# Patient Record
Sex: Male | Born: 1985 | Race: Black or African American | Hispanic: No | Marital: Single | Smoking: Current every day smoker
Health system: Southern US, Community
[De-identification: ages and names within clinical notes are randomized; demographics above are authoritative.]

## PROBLEM LIST (undated history)

## (undated) ENCOUNTER — Emergency Department (HOSPITAL_COMMUNITY): Admission: EM | Disposition: A | Discharge status: Home / Self Care | Payer: Medicaid Other

## (undated) HISTORY — PX: FEMUR SURGERY: SHX943

---
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
## 2008-04-20 ENCOUNTER — Emergency Department (HOSPITAL_COMMUNITY): Admission: EM | Admit: 2008-04-20 | Discharge: 2008-04-20 | Payer: Self-pay | Admitting: Emergency Medicine

## 2008-06-22 ENCOUNTER — Emergency Department (HOSPITAL_COMMUNITY): Admission: EM | Admit: 2008-06-22 | Discharge: 2008-06-22 | Payer: Self-pay | Admitting: Emergency Medicine

## 2009-04-29 ENCOUNTER — Emergency Department (HOSPITAL_COMMUNITY): Admission: EM | Admit: 2009-04-29 | Discharge: 2009-04-29 | Payer: Self-pay | Admitting: Emergency Medicine

## 2009-04-30 ENCOUNTER — Emergency Department (HOSPITAL_COMMUNITY): Admission: EM | Admit: 2009-04-30 | Discharge: 2009-04-30 | Payer: Self-pay | Admitting: Emergency Medicine

## 2009-07-25 ENCOUNTER — Emergency Department (HOSPITAL_COMMUNITY): Admission: EM | Admit: 2009-07-25 | Discharge: 2009-07-26 | Payer: Self-pay | Admitting: Emergency Medicine

## 2009-08-04 ENCOUNTER — Emergency Department (HOSPITAL_COMMUNITY): Admission: EM | Admit: 2009-08-04 | Discharge: 2009-08-04 | Payer: Self-pay | Admitting: Emergency Medicine

## 2010-08-06 ENCOUNTER — Emergency Department (HOSPITAL_COMMUNITY): Admission: EM | Admit: 2010-08-06 | Discharge: 2010-08-06 | Payer: Self-pay | Admitting: Emergency Medicine

## 2011-02-02 LAB — GC/CHLAMYDIA PROBE AMP, GENITAL: Chlamydia, DNA Probe: NEGATIVE

## 2011-02-05 LAB — CULTURE, ROUTINE-ABSCESS

## 2011-07-26 LAB — POCT I-STAT, CHEM 8
Creatinine, Ser: 1.1
Hemoglobin: 15
Potassium: 3.2 — ABNORMAL LOW
Sodium: 140
TCO2: 23

## 2011-07-26 LAB — ETHANOL: Alcohol, Ethyl (B): 131 — ABNORMAL HIGH

## 2012-01-27 ENCOUNTER — Emergency Department (HOSPITAL_COMMUNITY)
Admission: EM | Admit: 2012-01-27 | Discharge: 2012-01-27 | Disposition: A | Discharge status: Home / Self Care | Payer: Self-pay | Attending: Emergency Medicine | Admitting: Emergency Medicine

## 2012-01-27 ENCOUNTER — Encounter (HOSPITAL_COMMUNITY): Payer: Self-pay | Admitting: *Deleted

## 2012-01-27 DIAGNOSIS — F17200 Nicotine dependence, unspecified, uncomplicated: Secondary | ICD-10-CM | POA: Insufficient documentation

## 2012-01-27 DIAGNOSIS — L02411 Cutaneous abscess of right axilla: Secondary | ICD-10-CM

## 2012-01-27 DIAGNOSIS — IMO0002 Reserved for concepts with insufficient information to code with codable children: Secondary | ICD-10-CM | POA: Insufficient documentation

## 2012-01-27 MED ORDER — HYDROCODONE-ACETAMINOPHEN 5-325 MG PO TABS: 1.0000 | ORAL_TABLET | ORAL | Status: AC | PRN

## 2012-01-27 MED ORDER — IBUPROFEN 800 MG PO TABS: 400.0000 mg | ORAL_TABLET | Freq: Three times a day (TID) | ORAL | Status: AC

## 2012-01-27 NOTE — Discharge Instructions (Signed)
Take vicodin as prescribed for severe pain.   Do not drive within four hours of taking this medication (may cause drowsiness or confusion).   Take ibuprofen for mild-moderate pain.  Follow up with [facility] [phone]; [address])  Or return to the ED in 2 days for wound recheck and packing removal.  You should be seen sooner if you develop spreading of redness around wound.   Call [facility] [phone]) if you do not have a primary care doctor and would like assistance with finding one.

## 2012-01-27 NOTE — ED Notes (Signed)
Pt reports abscess under right arm starting two days ago.  Pt reports history of having abscesses in this same area. Pt denies taking anything for pain and reports pain is 10/10.  Pt denies abscess drainage.

## 2012-01-27 NOTE — ED Provider Notes (Signed)
History     CSN: [id number]  Arrival date & time 01/27/12  1454   First MD Initiated Contact with Patient 01/27/12 1536      Chief Complaint  Patient presents with  . Abscess    (Consider location/radiation/quality/duration/timing/severity/associated sxs/prior treatment) HPI History provided by pt.   Pt c/o abscess of right axilla x 2 days.  Severely pain.  Has not taken anything for pain.  Has applied warm compresses w/out drainage.  No associated fever.  Has had multiple in same location in the past.    History reviewed. No pertinent past medical history.  History reviewed. No pertinent past surgical history.  No family history on file.  History  Substance Use Topics  . Smoking status: Current Everyday Smoker  . Smokeless tobacco: Not on file  . Alcohol Use: No      Review of Systems  All other systems reviewed and are negative.    Allergies  Review of patient's allergies indicates no known allergies.  Home Medications  No current outpatient prescriptions on file.  BP 133/66  Pulse 64  Temp(Src) 98.3 F (36.8 C) (Oral)  Resp 16  SpO2 100%  Physical Exam  Nursing note and vitals reviewed. Constitutional: He is oriented to person, place, and time. He appears well-developed and well-nourished. No distress.  HENT:  Head: Normocephalic and atraumatic.  Eyes:       Normal appearance  Neck: Normal range of motion.  Neurological: He is alert and oriented to person, place, and time.  Skin:       4x2cm abscess right axilla w/out surrounding induration or erythema.  Very ttp.    Psychiatric: He has a normal mood and affect. His behavior is normal.    ED Course  Procedures (including critical care time)  INCISION AND DRAINAGE Performed by: Otilio Miu Consent: Verbal consent obtained. Risks and benefits: risks, benefits and alternatives were discussed Type: abscess  Body area: right axilla  Anesthesia: local infiltration  Local  anesthetic: lidocaine 2% w/ epinephrine  Anesthetic total: 8 ml  Complexity: complex Blunt dissection to break up loculations  Drainage: purulent  Drainage amount: moderate  Packing material: 1/4 in iodoform gauze  Patient tolerance: Patient tolerated the procedure well with no immediate complications.    Labs Reviewed - No data to display No results found.   1. Abscess of right axilla       MDM  Healthy 25yo F presents w/ abscess of right axilla.  I&D'd and pt d/c'd home w/ ibuprofen and vicodin for pain.  Instructed to f/u with [facility] or return to ED in 2 days for recheck. Return precautions discussed.        Otilio Miu, [place] 01/27/12 [phone]

## 2012-01-27 NOTE — ED Provider Notes (Signed)
Medical screening examination/treatment/procedure(s) were performed by non-physician practitioner and as supervising physician I was immediately available for consultation/collaboration.   Sira Adsit, MD 01/27/12 2320 

## 2012-06-21 ENCOUNTER — Encounter (HOSPITAL_COMMUNITY): Payer: Self-pay | Admitting: *Deleted

## 2012-06-21 ENCOUNTER — Encounter (HOSPITAL_COMMUNITY): Payer: Self-pay | Admitting: Emergency Medicine

## 2012-06-21 ENCOUNTER — Emergency Department (HOSPITAL_COMMUNITY)
Admission: EM | Admit: 2012-06-21 | Discharge: 2012-06-21 | Disposition: A | Discharge status: Home / Self Care | Payer: Self-pay | Attending: Emergency Medicine | Admitting: Emergency Medicine

## 2012-06-21 ENCOUNTER — Emergency Department (HOSPITAL_COMMUNITY)
Admission: EM | Admit: 2012-06-21 | Discharge: 2012-06-21 | Disposition: A | Discharge status: Home / Self Care | Payer: Medicaid Other | Attending: Emergency Medicine | Admitting: Emergency Medicine

## 2012-06-21 DIAGNOSIS — T148XXA Other injury of unspecified body region, initial encounter: Secondary | ICD-10-CM | POA: Insufficient documentation

## 2012-06-21 DIAGNOSIS — W269XXA Contact with unspecified sharp object(s), initial encounter: Secondary | ICD-10-CM | POA: Insufficient documentation

## 2012-06-21 NOTE — ED Notes (Signed)
Dr. Ranae Palms came to triage to examine patient.  No trauma activation.

## 2012-06-21 NOTE — ED Notes (Signed)
Called in waiting room x3 with no response 

## 2012-06-21 NOTE — ED Notes (Signed)
Pt refused to stay for treatment for [employer], pt never assessed by this Clinical research associate
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
## 2012-06-26 ENCOUNTER — Encounter (HOSPITAL_COMMUNITY): Payer: Self-pay

## 2012-06-26 ENCOUNTER — Emergency Department (HOSPITAL_COMMUNITY)
Admission: EM | Admit: 2012-06-26 | Discharge: 2012-06-26 | Disposition: A | Discharge status: Home / Self Care | Payer: Medicaid Other | Attending: Emergency Medicine | Admitting: Emergency Medicine

## 2012-06-26 ENCOUNTER — Emergency Department (HOSPITAL_COMMUNITY): Payer: Medicaid Other

## 2012-06-26 DIAGNOSIS — R109 Unspecified abdominal pain: Secondary | ICD-10-CM | POA: Insufficient documentation

## 2012-06-26 DIAGNOSIS — F17200 Nicotine dependence, unspecified, uncomplicated: Secondary | ICD-10-CM | POA: Insufficient documentation

## 2012-06-26 DIAGNOSIS — Z09 Encounter for follow-up examination after completed treatment for conditions other than malignant neoplasm: Secondary | ICD-10-CM | POA: Insufficient documentation

## 2012-06-26 DIAGNOSIS — M549 Dorsalgia, unspecified: Secondary | ICD-10-CM | POA: Insufficient documentation

## 2012-06-26 LAB — CBC
Platelets: 176 10*3/uL (ref 150–400)
RBC: 4.59 MIL/uL (ref 4.22–5.81)
RDW: 13.7 % (ref 11.5–15.5)
WBC: 7.3 10*3/uL (ref 4.0–10.5)

## 2012-06-26 LAB — URINALYSIS, ROUTINE W REFLEX MICROSCOPIC
Hgb urine dipstick: NEGATIVE
Nitrite: NEGATIVE
Protein, ur: NEGATIVE mg/dL
Specific Gravity, Urine: 1.03 (ref 1.005–1.030)
Urobilinogen, UA: 1 mg/dL (ref 0.0–1.0)

## 2012-06-26 LAB — BASIC METABOLIC PANEL
CO2: 28 mEq/L (ref 19–32)
Chloride: 104 mEq/L (ref 96–112)
Creatinine, Ser: 0.86 mg/dL (ref 0.50–1.35)
GFR calc Af Amer: >90 mL/min (ref >90)
Potassium: 4 mEq/L (ref 3.5–5.1)
Sodium: 139 mEq/L (ref 135–145)

## 2012-06-26 LAB — URINE MICROSCOPIC-ADD ON

## 2012-06-26 MED ORDER — NAPROXEN 500 MG PO TABS: 500.0000 mg | ORAL_TABLET | Freq: Two times a day (BID) | ORAL | Status: DC

## 2012-06-26 MED ORDER — IOHEXOL 300 MG/ML  SOLN
100.0000 mL | Freq: Once | INTRAMUSCULAR | Status: AC | PRN
  Administered 2012-06-26: 100 mL via INTRAVENOUS

## 2012-06-26 MED ORDER — CYCLOBENZAPRINE HCL 5 MG PO TABS: 5.0000 mg | ORAL_TABLET | Freq: Three times a day (TID) | ORAL | Status: DC | PRN

## 2012-06-26 MED ORDER — MORPHINE SULFATE 4 MG/ML IJ SOLN
4.0000 mg | INTRAMUSCULAR | Status: DC | PRN
  Administered 2012-06-26 (×2): 4 mg via INTRAVENOUS
  Filled 2012-06-26 (×2): qty 1

## 2012-06-26 MED ORDER — HYDROCODONE-ACETAMINOPHEN 5-325 MG PO TABS: 1.0000 | ORAL_TABLET | Freq: Four times a day (QID) | ORAL | Status: DC | PRN
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
## 2012-06-26 NOTE — ED Notes (Signed)
Pt laying on stretcher talking on phone; Resp even and unlabored; no acute distress noted.
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
## 2012-06-29 ENCOUNTER — Emergency Department (HOSPITAL_COMMUNITY)
Admission: EM | Admit: 2012-06-29 | Discharge: 2012-06-30 | Disposition: A | Discharge status: Home / Self Care | Payer: Medicaid Other | Attending: Emergency Medicine | Admitting: Emergency Medicine

## 2012-06-29 ENCOUNTER — Encounter (HOSPITAL_COMMUNITY): Payer: Self-pay | Admitting: Emergency Medicine

## 2012-06-29 DIAGNOSIS — M549 Dorsalgia, unspecified: Secondary | ICD-10-CM | POA: Insufficient documentation

## 2012-06-29 DIAGNOSIS — A64 Unspecified sexually transmitted disease: Secondary | ICD-10-CM

## 2012-06-29 DIAGNOSIS — F17200 Nicotine dependence, unspecified, uncomplicated: Secondary | ICD-10-CM | POA: Insufficient documentation

## 2012-06-29 DIAGNOSIS — R300 Dysuria: Secondary | ICD-10-CM | POA: Insufficient documentation
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
## 2012-06-30 ENCOUNTER — Emergency Department (HOSPITAL_COMMUNITY): Payer: Medicaid Other

## 2012-06-30 LAB — URINALYSIS, ROUTINE W REFLEX MICROSCOPIC
Glucose, UA: NEGATIVE mg/dL
Hgb urine dipstick: NEGATIVE
Ketones, ur: 15 mg/dL — AB
Protein, ur: NEGATIVE mg/dL

## 2012-06-30 MED ORDER — CEFTRIAXONE SODIUM 250 MG IJ SOLR
250.0000 mg | Freq: Once | INTRAMUSCULAR | Status: AC
  Administered 2012-06-30: 250 mg via INTRAMUSCULAR
  Filled 2012-06-30: qty 250

## 2012-06-30 MED ORDER — AZITHROMYCIN 250 MG PO TABS
1000.0000 mg | ORAL_TABLET | Freq: Once | ORAL | Status: AC
  Administered 2012-06-30: 1000 mg via ORAL
  Filled 2012-06-30: qty 4

## 2012-06-30 MED ORDER — OXYCODONE-ACETAMINOPHEN 5-325 MG PO TABS
1.0000 | ORAL_TABLET | Freq: Once | ORAL | Status: AC
  Administered 2012-06-30: 1 via ORAL
  Filled 2012-06-30: qty 1

## 2012-06-30 MED ORDER — IBUPROFEN 800 MG PO TABS
800.0000 mg | ORAL_TABLET | Freq: Once | ORAL | Status: AC
  Administered 2012-06-30: 800 mg via ORAL
  Filled 2012-06-30: qty 1

## 2012-06-30 MED ORDER — OXYCODONE-ACETAMINOPHEN 5-325 MG PO TABS: 2.0000 | ORAL_TABLET | ORAL | Status: AC | PRN
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
## 2012-06-30 NOTE — ED Notes (Signed)
Bed:WA13<BR> Expected date:<BR> Expected time:<BR> Means of arrival:<BR> Comments:<BR> Terminal clean

## 2012-06-30 NOTE — ED Notes (Signed)
PA, Crawford at bedside.

## 2012-06-30 NOTE — ED Provider Notes (Signed)
Pt received from Brent[place], NP.  US scrotum normal.  Results discussed w/ patient.  Pt is well-appearing and in NAD.  He reports improvement in pain w/ percocet.  D/c'd home w/ referral to urology for persistent testicular pain as well as short course of percocet (no relief w/ vicodin at home).  He has also been referred to STD clinic.  Return precautions discussed.   Otilio Miu, [place] 06/30/12 [phone]

## 2012-06-30 NOTE — ED Provider Notes (Signed)
Medical screening examination/treatment/procedure(s) were performed by non-physician practitioner and as supervising physician I was immediately available for consultation/collaboration.  Nayleah Gamel, MD 06/30/12 0738 

## 2012-06-30 NOTE — ED Notes (Signed)
Patient is alert and oriented x3.  She was given DC instructions and follow up visit instructions.  Patient gave verbal understanding. She was DC ambulatory under his own power to home.  V/S stable.  He was not showing any signs of distress on DC 

## 2012-06-30 NOTE — ED Notes (Signed)
PA, Crawford at bedside. 

## 2012-07-01 LAB — GC/CHLAMYDIA PROBE AMP, GENITAL: GC Probe Amp, Genital: NEGATIVE

## 2012-07-01 NOTE — ED Provider Notes (Signed)
Medical screening examination/treatment/procedure(s) were performed by non-physician practitioner and as supervising physician I was immediately available for consultation/collaboration. Devoria Albe, MD, FACEP   Ward Givens, MD 07/01/12 [phone]

## 2013-08-03 ENCOUNTER — Encounter: Payer: Self-pay | Admitting: *Deleted
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
## 2013-08-07 NOTE — Telephone Encounter (Signed)
This encounter was created in error - please disregard.

## 2014-04-08 ENCOUNTER — Emergency Department: Payer: Self-pay | Admitting: Emergency Medicine

## 2015-12-23 ENCOUNTER — Inpatient Hospital Stay (HOSPITAL_COMMUNITY): Payer: No Typology Code available for payment source

## 2015-12-23 ENCOUNTER — Encounter (HOSPITAL_COMMUNITY): Payer: Self-pay | Admitting: Emergency Medicine

## 2015-12-23 ENCOUNTER — Encounter (HOSPITAL_COMMUNITY): Admission: EM | Disposition: A | Discharge status: Home / Self Care | Payer: Self-pay | Source: Home / Self Care

## 2015-12-23 ENCOUNTER — Inpatient Hospital Stay (HOSPITAL_COMMUNITY): Payer: No Typology Code available for payment source | Admitting: Anesthesiology

## 2015-12-23 ENCOUNTER — Emergency Department (HOSPITAL_COMMUNITY): Payer: No Typology Code available for payment source

## 2015-12-23 ENCOUNTER — Inpatient Hospital Stay (HOSPITAL_COMMUNITY)
Admission: EM | Admit: 2015-12-23 | Discharge: 2015-12-26 | DRG: 956 | Disposition: A | Discharge status: Home / Self Care | Payer: No Typology Code available for payment source | Attending: General Surgery | Admitting: General Surgery

## 2015-12-23 DIAGNOSIS — S7291XA Unspecified fracture of right femur, initial encounter for closed fracture: Secondary | ICD-10-CM

## 2015-12-23 DIAGNOSIS — S2241XA Multiple fractures of ribs, right side, initial encounter for closed fracture: Secondary | ICD-10-CM | POA: Diagnosis present

## 2015-12-23 DIAGNOSIS — D62 Acute posthemorrhagic anemia: Secondary | ICD-10-CM | POA: Diagnosis present

## 2015-12-23 DIAGNOSIS — S01511A Laceration without foreign body of lip, initial encounter: Secondary | ICD-10-CM | POA: Diagnosis present

## 2015-12-23 DIAGNOSIS — S72351A Displaced comminuted fracture of shaft of right femur, initial encounter for closed fracture: Principal | ICD-10-CM | POA: Diagnosis present

## 2015-12-23 DIAGNOSIS — S36116A Major laceration of liver, initial encounter: Secondary | ICD-10-CM | POA: Diagnosis present

## 2015-12-23 DIAGNOSIS — Y907 Blood alcohol level of 200-239 mg/100 ml: Secondary | ICD-10-CM | POA: Diagnosis present

## 2015-12-23 DIAGNOSIS — Z419 Encounter for procedure for purposes other than remedying health state, unspecified: Secondary | ICD-10-CM

## 2015-12-23 DIAGNOSIS — F17200 Nicotine dependence, unspecified, uncomplicated: Secondary | ICD-10-CM | POA: Diagnosis present

## 2015-12-23 DIAGNOSIS — S36113A Laceration of liver, unspecified degree, initial encounter: Secondary | ICD-10-CM

## 2015-12-23 DIAGNOSIS — F10129 Alcohol abuse with intoxication, unspecified: Secondary | ICD-10-CM | POA: Diagnosis present

## 2015-12-23 DIAGNOSIS — S2231XA Fracture of one rib, right side, initial encounter for closed fracture: Secondary | ICD-10-CM

## 2015-12-23 HISTORY — PX: FEMUR IM NAIL: SHX1597

## 2015-12-23 LAB — CBC WITH DIFFERENTIAL/PLATELET
BASOS ABS: 0 10*3/uL (ref 0.0–0.1)
BASOS PCT: 0 %
EOS PCT: 1 %
Eosinophils Absolute: 0.1 10*3/uL (ref 0.0–0.7)
HEMATOCRIT: 42.3 % (ref 39.0–52.0)
HEMOGLOBIN: 14.2 g/dL (ref 13.0–17.0)
Lymphocytes Relative: 27 %
Lymphs Abs: 3.3 10*3/uL (ref 0.7–4.0)
MCH: 30.2 pg (ref 26.0–34.0)
MCHC: 33.6 g/dL (ref 30.0–36.0)
MCV: 90 fL (ref 78.0–100.0)
MONO ABS: 0.8 10*3/uL (ref 0.1–1.0)
MONOS PCT: 7 %
NEUTROS ABS: 7.9 10*3/uL — AB (ref 1.7–7.7)
Neutrophils Relative %: 65 %
PLATELETS: 186 10*3/uL (ref 150–400)
RBC: 4.7 MIL/uL (ref 4.22–5.81)
RDW: 13.3 % (ref 11.5–15.5)
WBC: 12.1 10*3/uL — ABNORMAL HIGH (ref 4.0–10.5)

## 2015-12-23 LAB — I-STAT CHEM 8, ED
BUN: 13 mg/dL (ref 6–20)
CALCIUM ION: 1.04 mmol/L — AB (ref 1.12–1.23)
CHLORIDE: 106 mmol/L (ref 101–111)
CREATININE: 1.2 mg/dL (ref 0.61–1.24)
GLUCOSE: 151 mg/dL — AB (ref 65–99)
HCT: 47 % (ref 39.0–52.0)
Hemoglobin: 16 g/dL (ref 13.0–17.0)
POTASSIUM: 3.3 mmol/L — AB (ref 3.5–5.1)
Sodium: 144 mmol/L (ref 135–145)
TCO2: 23 mmol/L (ref 0–100)

## 2015-12-23 LAB — CBC
HEMATOCRIT: 35 % — AB (ref 39.0–52.0)
HEMOGLOBIN: 12.2 g/dL — AB (ref 13.0–17.0)
MCH: 30.4 pg (ref 26.0–34.0)
MCHC: 34.9 g/dL (ref 30.0–36.0)
MCV: 87.3 fL (ref 78.0–100.0)
Platelets: 164 10*3/uL (ref 150–400)
RBC: 4.01 MIL/uL — ABNORMAL LOW (ref 4.22–5.81)
RDW: 13.4 % (ref 11.5–15.5)
WBC: 14 10*3/uL — ABNORMAL HIGH (ref 4.0–10.5)

## 2015-12-23 LAB — APTT: APTT: 27 s (ref 24–37)

## 2015-12-23 LAB — PROTIME-INR
INR: 1.37 (ref 0.00–1.49)
Prothrombin Time: 17 seconds — ABNORMAL HIGH (ref 11.6–15.2)

## 2015-12-23 LAB — ETHANOL: ALCOHOL ETHYL (B): 228 mg/dL — AB (ref <5)

## 2015-12-23 SURGERY — INSERTION, INTRAMEDULLARY ROD, FEMUR
Anesthesia: General | Site: Leg Upper | Laterality: Right

## 2015-12-23 MED ORDER — CEFAZOLIN SODIUM-DEXTROSE 2-3 GM-% IV SOLR
2.0000 g | Freq: Three times a day (TID) | INTRAVENOUS | Status: AC
  Administered 2015-12-23 – 2015-12-24 (×3): 2 g via INTRAVENOUS
  Filled 2015-12-23 (×3): qty 50

## 2015-12-23 MED ORDER — HYDROMORPHONE HCL 1 MG/ML IJ SOLN
0.2500 mg | INTRAMUSCULAR | Status: DC | PRN
  Administered 2015-12-23 (×4): 0.5 mg via INTRAVENOUS

## 2015-12-23 MED ORDER — LIDOCAINE HCL (CARDIAC) 20 MG/ML IV SOLN
INTRAVENOUS | Status: AC
  Filled 2015-12-23: qty 10

## 2015-12-23 MED ORDER — OXYCODONE HCL 5 MG PO TABS
15.0000 mg | ORAL_TABLET | Freq: Once | ORAL | Status: AC
  Administered 2015-12-23: 15 mg via ORAL
  Filled 2015-12-23: qty 3

## 2015-12-23 MED ORDER — 0.9 % SODIUM CHLORIDE (POUR BTL) OPTIME
TOPICAL | Status: DC | PRN
  Administered 2015-12-23: 1000 mL

## 2015-12-23 MED ORDER — HYDROMORPHONE HCL 1 MG/ML IJ SOLN
INTRAMUSCULAR | Status: AC
  Administered 2015-12-23: 0.5 mg via INTRAVENOUS
  Filled 2015-12-23: qty 1

## 2015-12-23 MED ORDER — FENTANYL CITRATE (PF) 250 MCG/5ML IJ SOLN
INTRAMUSCULAR | Status: AC
  Filled 2015-12-23: qty 5

## 2015-12-23 MED ORDER — PANTOPRAZOLE SODIUM 40 MG IV SOLR
40.0000 mg | Freq: Every day | INTRAVENOUS | Status: DC
  Administered 2015-12-23: 40 mg via INTRAVENOUS
  Filled 2015-12-23: qty 40

## 2015-12-23 MED ORDER — PROPOFOL 10 MG/ML IV BOLUS
INTRAVENOUS | Status: AC
  Filled 2015-12-23: qty 20

## 2015-12-23 MED ORDER — OXYCODONE-ACETAMINOPHEN 5-325 MG PO TABS
1.0000 | ORAL_TABLET | Freq: Four times a day (QID) | ORAL | Status: DC | PRN
  Administered 2015-12-24: 2 via ORAL
  Filled 2015-12-23: qty 2

## 2015-12-23 MED ORDER — OXYCODONE HCL 5 MG PO TABS
ORAL_TABLET | ORAL | Status: AC
  Administered 2015-12-23: 15 mg via ORAL
  Filled 2015-12-23: qty 3

## 2015-12-23 MED ORDER — ROCURONIUM BROMIDE 100 MG/10ML IV SOLN
INTRAVENOUS | Status: DC | PRN
  Administered 2015-12-23: 50 mg via INTRAVENOUS
  Administered 2015-12-23: 40 mg via INTRAVENOUS

## 2015-12-23 MED ORDER — PHENYLEPHRINE HCL 10 MG/ML IJ SOLN
INTRAMUSCULAR | Status: DC | PRN
  Administered 2015-12-23 (×2): 80 ug via INTRAVENOUS

## 2015-12-23 MED ORDER — SUCCINYLCHOLINE CHLORIDE 20 MG/ML IJ SOLN
INTRAMUSCULAR | Status: AC
  Filled 2015-12-23: qty 1

## 2015-12-23 MED ORDER — HYDROMORPHONE HCL 1 MG/ML IJ SOLN
1.0000 mg | Freq: Once | INTRAMUSCULAR | Status: AC
  Administered 2015-12-23: 1 mg via INTRAVENOUS
  Filled 2015-12-23: qty 1

## 2015-12-23 MED ORDER — METHOCARBAMOL 1000 MG/10ML IJ SOLN
1000.0000 mg | Freq: Three times a day (TID) | INTRAVENOUS | Status: DC
  Administered 2015-12-23 – 2015-12-25 (×5): 1000 mg via INTRAVENOUS
  Filled 2015-12-23 (×8): qty 10

## 2015-12-23 MED ORDER — HYDROMORPHONE HCL 1 MG/ML IJ SOLN
INTRAMUSCULAR | Status: AC
  Filled 2015-12-23: qty 1

## 2015-12-23 MED ORDER — MIDAZOLAM HCL 2 MG/2ML IJ SOLN
INTRAMUSCULAR | Status: AC
  Filled 2015-12-23: qty 2

## 2015-12-23 MED ORDER — ONDANSETRON HCL 4 MG PO TABS: 4.0000 mg | ORAL_TABLET | Freq: Four times a day (QID) | ORAL | Status: DC | PRN

## 2015-12-23 MED ORDER — PROMETHAZINE HCL 25 MG/ML IJ SOLN
INTRAMUSCULAR | Status: AC
  Administered 2015-12-23: 6.25 mg via INTRAVENOUS
  Filled 2015-12-23: qty 1

## 2015-12-23 MED ORDER — SUGAMMADEX SODIUM 200 MG/2ML IV SOLN
INTRAVENOUS | Status: DC | PRN
  Administered 2015-12-23: 500 mg via INTRAVENOUS

## 2015-12-23 MED ORDER — LACTATED RINGERS IV SOLN
INTRAVENOUS | Status: DC
  Administered 2015-12-23: 10:00:00 via INTRAVENOUS

## 2015-12-23 MED ORDER — PROPOFOL 10 MG/ML IV BOLUS
INTRAVENOUS | Status: DC | PRN
  Administered 2015-12-23: 50 mg via INTRAVENOUS
  Administered 2015-12-23: 150 mg via INTRAVENOUS

## 2015-12-23 MED ORDER — TETANUS-DIPHTH-ACELL PERTUSSIS 5-2.5-18.5 LF-MCG/0.5 IM SUSP
0.5000 mL | Freq: Once | INTRAMUSCULAR | Status: AC
  Administered 2015-12-23: 0.5 mL via INTRAMUSCULAR
  Filled 2015-12-23: qty 0.5

## 2015-12-23 MED ORDER — SUGAMMADEX SODIUM 500 MG/5ML IV SOLN
INTRAVENOUS | Status: AC
  Filled 2015-12-23: qty 5

## 2015-12-23 MED ORDER — POTASSIUM CHLORIDE IN NACL 20-0.45 MEQ/L-% IV SOLN
INTRAVENOUS | Status: DC
  Administered 2015-12-23 – 2015-12-25 (×3): via INTRAVENOUS
  Filled 2015-12-23 (×5): qty 1000

## 2015-12-23 MED ORDER — CEFAZOLIN SODIUM 10 G IJ SOLR
3.0000 g | INTRAMUSCULAR | Status: AC
  Administered 2015-12-23: 3 g via INTRAVENOUS
  Filled 2015-12-23 (×2): qty 3000

## 2015-12-23 MED ORDER — PHENYLEPHRINE 40 MCG/ML (10ML) SYRINGE FOR IV PUSH (FOR BLOOD PRESSURE SUPPORT)
PREFILLED_SYRINGE | INTRAVENOUS | Status: AC
  Filled 2015-12-23: qty 10

## 2015-12-23 MED ORDER — ONDANSETRON HCL 4 MG/2ML IJ SOLN
INTRAMUSCULAR | Status: AC
  Filled 2015-12-23: qty 2

## 2015-12-23 MED ORDER — HYDROMORPHONE HCL 1 MG/ML IJ SOLN
0.5000 mg | INTRAMUSCULAR | Status: DC | PRN
  Administered 2015-12-23 (×2): 0.5 mg via INTRAVENOUS
  Filled 2015-12-23: qty 1

## 2015-12-23 MED ORDER — LACTATED RINGERS IV SOLN
INTRAVENOUS | Status: DC | PRN
  Administered 2015-12-23 (×2): via INTRAVENOUS

## 2015-12-23 MED ORDER — BACITRACIN ZINC 500 UNIT/GM EX OINT
TOPICAL_OINTMENT | CUTANEOUS | Status: AC
  Filled 2015-12-23: qty 28.35

## 2015-12-23 MED ORDER — FENTANYL CITRATE (PF) 250 MCG/5ML IJ SOLN
INTRAMUSCULAR | Status: DC | PRN
Start: 2015-12-23 — End: 2015-12-23
  Administered 2015-12-23: 50 ug via INTRAVENOUS
  Administered 2015-12-23 (×2): 100 ug via INTRAVENOUS
  Administered 2015-12-23 (×3): 50 ug via INTRAVENOUS
  Administered 2015-12-23: 100 ug via INTRAVENOUS

## 2015-12-23 MED ORDER — ROCURONIUM BROMIDE 50 MG/5ML IV SOLN
INTRAVENOUS | Status: AC
  Filled 2015-12-23: qty 2

## 2015-12-23 MED ORDER — ONDANSETRON HCL 4 MG/2ML IJ SOLN
4.0000 mg | Freq: Four times a day (QID) | INTRAMUSCULAR | Status: DC | PRN
  Administered 2015-12-23: 4 mg via INTRAVENOUS
  Filled 2015-12-23: qty 2

## 2015-12-23 MED ORDER — DEXAMETHASONE SODIUM PHOSPHATE 4 MG/ML IJ SOLN
INTRAMUSCULAR | Status: DC | PRN
  Administered 2015-12-23: 10 mg via INTRAVENOUS

## 2015-12-23 MED ORDER — OXYCODONE HCL 5 MG PO TABS
5.0000 mg | ORAL_TABLET | ORAL | Status: DC | PRN
  Administered 2015-12-23 – 2015-12-24 (×4): 15 mg via ORAL
  Filled 2015-12-23 (×3): qty 3

## 2015-12-23 MED ORDER — ONDANSETRON HCL 4 MG/2ML IJ SOLN
INTRAMUSCULAR | Status: DC | PRN
  Administered 2015-12-23: 4 mg via INTRAVENOUS

## 2015-12-23 MED ORDER — PANTOPRAZOLE SODIUM 40 MG PO TBEC
40.0000 mg | DELAYED_RELEASE_TABLET | Freq: Every day | ORAL | Status: DC
  Administered 2015-12-24 – 2015-12-26 (×3): 40 mg via ORAL
  Filled 2015-12-23 (×3): qty 1

## 2015-12-23 MED ORDER — HYDROMORPHONE HCL 1 MG/ML IJ SOLN
0.5000 mg | INTRAMUSCULAR | Status: DC | PRN
  Administered 2015-12-24: 1 mg via INTRAVENOUS
  Filled 2015-12-23: qty 1

## 2015-12-23 MED ORDER — SUCCINYLCHOLINE CHLORIDE 20 MG/ML IJ SOLN
INTRAMUSCULAR | Status: DC | PRN
  Administered 2015-12-23: 120 mg via INTRAVENOUS

## 2015-12-23 MED ORDER — IOHEXOL 300 MG/ML  SOLN
100.0000 mL | Freq: Once | INTRAMUSCULAR | Status: AC | PRN
  Administered 2015-12-23: 100 mL via INTRAVENOUS

## 2015-12-23 MED ORDER — LIDOCAINE HCL (CARDIAC) 20 MG/ML IV SOLN
INTRAVENOUS | Status: DC | PRN
  Administered 2015-12-23: 80 mg via INTRAVENOUS

## 2015-12-23 MED ORDER — LIDOCAINE HCL (CARDIAC) 20 MG/ML IV SOLN
INTRAVENOUS | Status: AC
  Filled 2015-12-23: qty 5

## 2015-12-23 MED ORDER — DOCUSATE SODIUM 100 MG PO CAPS
100.0000 mg | ORAL_CAPSULE | Freq: Two times a day (BID) | ORAL | Status: DC
  Administered 2015-12-23 – 2015-12-26 (×6): 100 mg via ORAL
  Filled 2015-12-23 (×6): qty 1

## 2015-12-23 MED ORDER — PROMETHAZINE HCL 25 MG/ML IJ SOLN
6.2500 mg | INTRAMUSCULAR | Status: DC | PRN
  Administered 2015-12-23: 6.25 mg via INTRAVENOUS

## 2015-12-23 MED ORDER — DEXAMETHASONE SODIUM PHOSPHATE 10 MG/ML IJ SOLN
INTRAMUSCULAR | Status: AC
  Filled 2015-12-23: qty 1

## 2015-12-23 SURGICAL SUPPLY — 49 items
BIT DRILL CALIBRATED 4.2 (BIT) ×1 IMPLANT
BIT DRILL SHORT 4.2 (BIT) ×1 IMPLANT
BRUSH SCRUB DISP (MISCELLANEOUS) ×3 IMPLANT
COVER PERINEAL POST (MISCELLANEOUS) ×3 IMPLANT
COVER SURGICAL LIGHT HANDLE (MISCELLANEOUS) ×3 IMPLANT
DRAPE C-ARMOR (DRAPES) ×3 IMPLANT
DRAPE ORTHO SPLIT 77X108 STRL (DRAPES)
DRAPE SURG ORHT 6 SPLT 77X108 (DRAPES) IMPLANT
DRAPE U-SHAPE 47X51 STRL (DRAPES) ×3 IMPLANT
DRILL BIT CALIBRATED 4.2 (BIT) ×3
DRILL BIT LG 15X280 (BIT) ×3 IMPLANT
DRILL BIT SHORT 4.2 (BIT) ×2
DRSG MEPILEX BORDER 4X4 (GAUZE/BANDAGES/DRESSINGS) ×3 IMPLANT
DRSG MEPILEX BORDER 4X8 (GAUZE/BANDAGES/DRESSINGS) ×3 IMPLANT
ELECT REM PT RETURN 9FT ADLT (ELECTROSURGICAL) ×3
ELECTRODE REM PT RTRN 9FT ADLT (ELECTROSURGICAL) ×1 IMPLANT
GLOVE BIO SURGEON STRL SZ7.5 (GLOVE) ×3 IMPLANT
GLOVE BIO SURGEON STRL SZ8 (GLOVE) ×3 IMPLANT
GLOVE BIOGEL PI IND STRL 6.5 (GLOVE) ×1 IMPLANT
GLOVE BIOGEL PI IND STRL 7.5 (GLOVE) ×2 IMPLANT
GLOVE BIOGEL PI IND STRL 8 (GLOVE) ×1 IMPLANT
GLOVE BIOGEL PI INDICATOR 6.5 (GLOVE) ×2
GLOVE BIOGEL PI INDICATOR 7.5 (GLOVE) ×4
GLOVE BIOGEL PI INDICATOR 8 (GLOVE) ×2
GLOVE ECLIPSE 6.5 STRL STRAW (GLOVE) ×3 IMPLANT
GOWN STRL REUS W/ TWL LRG LVL3 (GOWN DISPOSABLE) ×3 IMPLANT
GOWN STRL REUS W/ TWL XL LVL3 (GOWN DISPOSABLE) ×1 IMPLANT
GOWN STRL REUS W/TWL LRG LVL3 (GOWN DISPOSABLE) ×6
GOWN STRL REUS W/TWL XL LVL3 (GOWN DISPOSABLE) ×2
GUIDEWIRE 3.2X400 (WIRE) ×3 IMPLANT
KIT BASIN OR (CUSTOM PROCEDURE TRAY) ×3 IMPLANT
KIT ROOM TURNOVER OR (KITS) ×3 IMPLANT
NAIL FEM 9X380 RT (Nail) ×3 IMPLANT
NS IRRIG 1000ML POUR BTL (IV SOLUTION) ×3 IMPLANT
PACK GENERAL/GYN (CUSTOM PROCEDURE TRAY) ×3 IMPLANT
PAD ARMBOARD 7.5X6 YLW CONV (MISCELLANEOUS) ×6 IMPLANT
REAMER ROD DEEP FLUTE 2.5X950 (INSTRUMENTS) ×3 IMPLANT
SCREW LOCK STAR 5X44 (Screw) ×6 IMPLANT
SCREW LOCK STAR 5X52 (Screw) ×3 IMPLANT
SUT ETHILON 3 0 PS 1 (SUTURE) ×6 IMPLANT
SUT VIC AB 0 CT1 27 (SUTURE) ×2
SUT VIC AB 0 CT1 27XBRD ANBCTR (SUTURE) ×1 IMPLANT
SUT VIC AB 1 CT1 27 (SUTURE) ×2
SUT VIC AB 1 CT1 27XBRD ANBCTR (SUTURE) ×1 IMPLANT
SUT VIC AB 2-0 CT1 27 (SUTURE) ×2
SUT VIC AB 2-0 CT1 TAPERPNT 27 (SUTURE) ×1 IMPLANT
TOWEL OR 17X24 6PK STRL BLUE (TOWEL DISPOSABLE) ×3 IMPLANT
TOWEL OR 17X26 10 PK STRL BLUE (TOWEL DISPOSABLE) ×3 IMPLANT
WATER STERILE IRR 1000ML POUR (IV SOLUTION) ×3 IMPLANT
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
## 2015-12-23 NOTE — ED Notes (Signed)
Sallye Ober Database administrator) [phone]
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
## 2015-12-23 NOTE — Anesthesia Procedure Notes (Signed)
Procedure Name: Intubation Date/Time: 12/23/2015 10:39 AM Performed by: Reine Just Pre-anesthesia Checklist: Patient identified, Emergency Drugs available, Suction available, Patient being monitored and Timeout performed Patient Re-evaluated:Patient Re-evaluated prior to inductionOxygen Delivery Method: Circle system utilized and Simple face mask Preoxygenation: Pre-oxygenation with 100% oxygen Intubation Type: IV induction and Rapid sequence Laryngoscope Size: Glidescope and 4 Grade View: Grade I Tube type: Oral Tube size: 7.5 mm Number of attempts: 1 Airway Equipment and Method: Patient positioned with wedge pillow,  Stylet and Video-laryngoscopy Placement Confirmation: ETT inserted through vocal cords under direct vision,  positive ETCO2 and breath sounds checked- equal and bilateral Secured at: 23 cm Tube secured with: Tape Dental Injury: Teeth and Oropharynx as per pre-operative assessment
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
## 2015-12-23 NOTE — Anesthesia Postprocedure Evaluation (Signed)
Anesthesia Post Note  Patient: Jeremy Sims  Procedure(s) Performed: Procedure(s) (LRB): INTRAMEDULLARY (IM) NAIL FEMORAL (Right)  Patient location during evaluation: PACU Anesthesia Type: General Level of consciousness: awake and alert Pain management: pain level controlled Vital Signs Assessment: post-procedure vital signs reviewed and stable Respiratory status: spontaneous breathing, nonlabored ventilation, respiratory function stable and patient connected to nasal cannula oxygen Cardiovascular status: blood pressure returned to baseline and stable Postop Assessment: no signs of nausea or vomiting Anesthetic complications: no    Last Vitals:  Filed Vitals:   12/23/15 1422 12/23/15 1645  BP: 170/110 158/93  Pulse:  81  Temp: 36.3 C   Resp: 22 16    Last Pain:  Filed Vitals:   12/23/15 1653  PainSc: 10-Worst pain ever                 Jocelyne Reinertsen,W. EDMOND

## 2015-12-23 NOTE — Progress Notes (Signed)
Pt is holding for a 3 south bed. Family updated. Cell phone given to pt's wife. No other personal belongings are with the pt @ this time. Will call wife when we get a room assignment.
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
## 2015-12-24 LAB — BASIC METABOLIC PANEL
Anion gap: 8 (ref 5–15)
BUN: 7 mg/dL (ref 6–20)
CALCIUM: 8.2 mg/dL — AB (ref 8.9–10.3)
CO2: 27 mmol/L (ref 22–32)
CREATININE: 0.78 mg/dL (ref 0.61–1.24)
Chloride: 102 mmol/L (ref 101–111)
GFR calc Af Amer: >60 mL/min (ref >60)
GLUCOSE: 133 mg/dL — AB (ref 65–99)
POTASSIUM: 3.6 mmol/L (ref 3.5–5.1)
SODIUM: 137 mmol/L (ref 135–145)

## 2015-12-24 LAB — CBC
HCT: 30.8 % — ABNORMAL LOW (ref 39.0–52.0)
HCT: 31.1 % — ABNORMAL LOW (ref 39.0–52.0)
HEMATOCRIT: 32.6 % — AB (ref 39.0–52.0)
HEMATOCRIT: 31.7 % — AB (ref 39.0–52.0)
HEMOGLOBIN: 10.9 g/dL — AB (ref 13.0–17.0)
HEMOGLOBIN: 10.5 g/dL — AB (ref 13.0–17.0)
Hemoglobin: 11.4 g/dL — ABNORMAL LOW (ref 13.0–17.0)
Hemoglobin: 10.5 g/dL — ABNORMAL LOW (ref 13.0–17.0)
MCH: 30.9 pg (ref 26.0–34.0)
MCH: 30.8 pg (ref 26.0–34.0)
MCH: 30 pg (ref 26.0–34.0)
MCH: 30.1 pg (ref 26.0–34.0)
MCHC: 35 g/dL (ref 30.0–36.0)
MCHC: 34.4 g/dL (ref 30.0–36.0)
MCHC: 34.1 g/dL (ref 30.0–36.0)
MCHC: 33.8 g/dL (ref 30.0–36.0)
MCV: 88.3 fL (ref 78.0–100.0)
MCV: 89.5 fL (ref 78.0–100.0)
MCV: 88 fL (ref 78.0–100.0)
MCV: 89.1 fL (ref 78.0–100.0)
PLATELETS: 141 10*3/uL — AB (ref 150–400)
PLATELETS: 134 10*3/uL — AB (ref 150–400)
Platelets: 100 10*3/uL — ABNORMAL LOW (ref 150–400)
Platelets: 121 10*3/uL — ABNORMAL LOW (ref 150–400)
RBC: 3.69 MIL/uL — AB (ref 4.22–5.81)
RBC: 3.54 MIL/uL — ABNORMAL LOW (ref 4.22–5.81)
RBC: 3.5 MIL/uL — ABNORMAL LOW (ref 4.22–5.81)
RBC: 3.49 MIL/uL — AB (ref 4.22–5.81)
RDW: 13.7 % (ref 11.5–15.5)
RDW: 13.5 % (ref 11.5–15.5)
RDW: 13.5 % (ref 11.5–15.5)
RDW: 13.2 % (ref 11.5–15.5)
WBC: 12.3 10*3/uL — AB (ref 4.0–10.5)
WBC: 11.3 10*3/uL — ABNORMAL HIGH (ref 4.0–10.5)
WBC: 10 10*3/uL (ref 4.0–10.5)
WBC: 10 10*3/uL (ref 4.0–10.5)

## 2015-12-24 LAB — MRSA PCR SCREENING: MRSA BY PCR: NEGATIVE

## 2015-12-24 MED ORDER — OXYCODONE HCL 5 MG PO TABS
5.0000 mg | ORAL_TABLET | ORAL | Status: DC | PRN
  Administered 2015-12-24 (×2): 15 mg via ORAL
  Administered 2015-12-24: 10 mg via ORAL
  Administered 2015-12-25: 15 mg via ORAL
  Administered 2015-12-25 (×2): 20 mg via ORAL
  Administered 2015-12-25 (×3): 15 mg via ORAL
  Administered 2015-12-26 (×2): 20 mg via ORAL
  Filled 2015-12-24: qty 2
  Filled 2015-12-24 (×2): qty 4
  Filled 2015-12-24 (×3): qty 3
  Filled 2015-12-24 (×2): qty 2
  Filled 2015-12-24: qty 3
  Filled 2015-12-24: qty 4
  Filled 2015-12-24: qty 2
  Filled 2015-12-24: qty 3
  Filled 2015-12-24: qty 4

## 2015-12-24 MED ORDER — HYDROMORPHONE HCL 1 MG/ML IJ SOLN
0.5000 mg | INTRAMUSCULAR | Status: DC | PRN
  Administered 2015-12-24 – 2015-12-25 (×4): 2 mg via INTRAVENOUS
  Filled 2015-12-24 (×5): qty 2

## 2015-12-24 MED ORDER — SENNOSIDES-DOCUSATE SODIUM 8.6-50 MG PO TABS
1.0000 | ORAL_TABLET | Freq: Two times a day (BID) | ORAL | Status: DC
  Administered 2015-12-24 – 2015-12-26 (×5): 1 via ORAL
  Filled 2015-12-24 (×5): qty 1

## 2015-12-24 MED ORDER — BACITRACIN ZINC 500 UNIT/GM EX OINT
TOPICAL_OINTMENT | Freq: Two times a day (BID) | CUTANEOUS | Status: DC
  Administered 2015-12-24: 22:00:00 via TOPICAL
  Administered 2015-12-24 – 2015-12-25 (×3): 31.5556 via TOPICAL
  Filled 2015-12-24: qty 28.35
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
## 2015-12-24 NOTE — Progress Notes (Signed)
Pt R hip adhesive bandage noted to be saturated with sanguinous blood, ABD pad and hypafix tape used to reinforce around 0330.  Will continue to monitor

## 2015-12-24 NOTE — Progress Notes (Addendum)
Pt transferred from PACU to 3s15 after report received from RN.  Pt is oriented to surroundings with call bell within reach.  Pt asking for food/drink, no diet orders in.  Dr. Corliss Skains on call notified, clear liquids ordered until AM.  R hip dressings noted to have old blood on it.  Will continue to monitor.

## 2015-12-25 LAB — BASIC METABOLIC PANEL
ANION GAP: 7 (ref 5–15)
BUN: <5 mg/dL — ABNORMAL LOW (ref 6–20)
CO2: 27 mmol/L (ref 22–32)
Calcium: 8.2 mg/dL — ABNORMAL LOW (ref 8.9–10.3)
Chloride: 100 mmol/L — ABNORMAL LOW (ref 101–111)
Creatinine, Ser: 0.87 mg/dL (ref 0.61–1.24)
GFR calc Af Amer: >60 mL/min (ref >60)
Glucose, Bld: 115 mg/dL — ABNORMAL HIGH (ref 65–99)
POTASSIUM: 3.7 mmol/L (ref 3.5–5.1)
SODIUM: 134 mmol/L — AB (ref 135–145)

## 2015-12-25 MED ORDER — METHOCARBAMOL 500 MG PO TABS
1000.0000 mg | ORAL_TABLET | Freq: Three times a day (TID) | ORAL | Status: DC | PRN
  Administered 2015-12-25 – 2015-12-26 (×3): 1000 mg via ORAL
  Filled 2015-12-25 (×3): qty 2
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
## 2015-12-26 ENCOUNTER — Encounter (HOSPITAL_COMMUNITY): Payer: Self-pay | Admitting: Orthopedic Surgery

## 2015-12-26 DIAGNOSIS — D62 Acute posthemorrhagic anemia: Secondary | ICD-10-CM | POA: Diagnosis not present

## 2015-12-26 DIAGNOSIS — S2241XA Multiple fractures of ribs, right side, initial encounter for closed fracture: Secondary | ICD-10-CM | POA: Diagnosis present

## 2015-12-26 LAB — CBC
HCT: 30.6 % — ABNORMAL LOW (ref 39.0–52.0)
Hemoglobin: 10.2 g/dL — ABNORMAL LOW (ref 13.0–17.0)
MCH: 29.6 pg (ref 26.0–34.0)
MCHC: 33.3 g/dL (ref 30.0–36.0)
MCV: 88.7 fL (ref 78.0–100.0)
PLATELETS: 139 10*3/uL — AB (ref 150–400)
RBC: 3.45 MIL/uL — ABNORMAL LOW (ref 4.22–5.81)
RDW: 12.9 % (ref 11.5–15.5)
WBC: 11.2 10*3/uL — ABNORMAL HIGH (ref 4.0–10.5)

## 2015-12-26 LAB — BASIC METABOLIC PANEL
ANION GAP: 9 (ref 5–15)
BUN: 5 mg/dL — ABNORMAL LOW (ref 6–20)
CALCIUM: 8.5 mg/dL — AB (ref 8.9–10.3)
CO2: 29 mmol/L (ref 22–32)
CREATININE: 0.82 mg/dL (ref 0.61–1.24)
Chloride: 98 mmol/L — ABNORMAL LOW (ref 101–111)
GFR calc Af Amer: >60 mL/min (ref >60)
GLUCOSE: 115 mg/dL — AB (ref 65–99)
Potassium: 3.6 mmol/L (ref 3.5–5.1)
Sodium: 136 mmol/L (ref 135–145)

## 2015-12-26 MED ORDER — OXYCODONE-ACETAMINOPHEN 10-325 MG PO TABS: 1.0000 | ORAL_TABLET | ORAL | Status: DC | PRN

## 2015-12-26 MED ORDER — OXYCODONE-ACETAMINOPHEN 10-325 MG PO TABS: 1.0000 | ORAL_TABLET | ORAL | Status: AC | PRN

## 2015-12-26 MED ORDER — METHOCARBAMOL 500 MG PO TABS: 500.0000 mg | ORAL_TABLET | Freq: Four times a day (QID) | ORAL | Status: AC | PRN
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
## 2015-12-26 NOTE — Progress Notes (Signed)
Pt ready for discharge. Education/instructions reviewed with pt and girlfriend and all questions/concerns addressed. IV removed and belongings gathered. Pt will be transported out via wheelchair to girlfriend's car. Will continue to monitor
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
## 2015-12-26 NOTE — Discharge Instructions (Signed)
No driving while taking oxycodone.  No running, jumping, ball or contact sports, bikes, skateboards, motorcycles, etc for 3 months.   [facility] Discharge Instructions   General Discharge Instructions  WEIGHT BEARING STATUS: weightbearing as tolerated right leg  RANGE OF MOTION/ACTIVITY: range of motion as tolerated Right hip, knee and ankle  Wound Care: daily wound care. Ok to shower and wash wounds with soap and water. See instructions below   Discharge Wound Care Instructions  Do NOT apply any ointments, solutions or lotions to pin sites or surgical wounds.  These prevent needed drainage and even though solutions like hydrogen peroxide kill bacteria, they also damage cells lining the pin sites that help fight infection.  Applying lotions or ointments can keep the wounds moist and can cause them to breakdown and open up as well. This can increase the risk for infection. When in doubt call the office.  Surgical incisions should be dressed daily.  If any drainage is noted, use one layer of adaptic, then gauze, Kerlix, and an ace wrap.  Once the incision is completely dry and without drainage, it may be left open to air out.  Showering may begin 36-48 hours later.  Cleaning gently with soap and water.  Traumatic wounds should be dressed daily as well.    One layer of adaptic, gauze, Kerlix, then ace wrap.  The adaptic can be discontinued once the draining has ceased    If you have a wet to dry dressing: wet the gauze with saline the squeeze as much saline out so the gauze is moist (not soaking wet), place moistened gauze over wound, then place a dry gauze over the moist one, followed by Kerlix wrap, then ace wrap.   PAIN MEDICATION USE AND EXPECTATIONS  You have likely been given narcotic medications to help control your pain.  After a traumatic event that results in an fracture (broken bone) with or without surgery, it is ok to use narcotic pain medications to help  control one's pain.  We understand that everyone responds to pain differently and each individual patient will be evaluated on a regular basis for the continued need for narcotic medications. Ideally, narcotic medication use should last no more than 6-8 weeks (coinciding with fracture healing).   As a patient it is your responsibility as well to monitor narcotic medication use and report the amount and frequency you use these medications when you come to your office visit.   We would also advise that if you are using narcotic medications, you should take a dose prior to therapy to maximize you participation.  IF YOU ARE ON NARCOTIC MEDICATIONS IT IS NOT PERMISSIBLE TO OPERATE A MOTOR VEHICLE (MOTORCYCLE/CAR/TRUCK/MOPED) OR HEAVY MACHINERY DO NOT MIX NARCOTICS WITH OTHER CNS (CENTRAL NERVOUS SYSTEM) DEPRESSANTS SUCH AS ALCOHOL  Diet: as you were eating previously.  Can use over the counter stool softeners and bowel preparations, such as Miralax, to help with bowel movements.  Narcotics can be constipating.  Be sure to drink plenty of fluids    STOP SMOKING OR USING NICOTINE PRODUCTS!!!!  As discussed nicotine severely impairs your body's ability to heal surgical and traumatic wounds but also impairs bone healing.  Wounds and bone heal by forming microscopic blood vessels (angiogenesis) and nicotine is a vasoconstrictor (essentially, shrinks blood vessels).  Therefore, if vasoconstriction occurs to these microscopic blood vessels they essentially disappear and are unable to deliver necessary nutrients to the healing tissue.  This is one modifiable factor that you can  do to dramatically increase your chances of healing your injury.    (This means no smoking, no nicotine gum, patches, etc)  DO NOT USE NONSTEROIDAL ANTI-INFLAMMATORY DRUGS (NSAID'S)  Using products such as Advil (ibuprofen), Aleve (naproxen), Motrin (ibuprofen) for additional pain control during fracture healing can delay and/or prevent  the healing response.  If you would like to take over the counter (OTC) medication, Tylenol (acetaminophen) is ok.  However, some narcotic medications that are given for pain control contain acetaminophen as well. Therefore, you should not exceed more than 4000 mg of tylenol in a day if you do not have liver disease.  Also note that there are may OTC medicines, such as cold medicines and allergy medicines that my contain tylenol as well.  If you have any questions about medications and/or interactions please ask your doctor/PA or your pharmacist.      ICE AND ELEVATE INJURED/OPERATIVE EXTREMITY  Using ice and elevating the injured extremity above your heart can help with swelling and pain control.  Icing in a pulsatile fashion, such as 20 minutes on and 20 minutes off, can be followed.    Do not place ice directly on skin. Make sure there is a barrier between to skin and the ice pack.    Using frozen items such as frozen peas works well as the conform nicely to the are that needs to be iced.  USE AN ACE WRAP OR TED HOSE FOR SWELLING CONTROL  In addition to icing and elevation, Ace wraps or TED hose are used to help limit and resolve swelling.  It is recommended to use Ace wraps or TED hose until you are informed to stop.    When using Ace Wraps start the wrapping distally (farthest away from the body) and wrap proximally (closer to the body)   Example: If you had surgery on your leg or thing and you do not have a splint on, start the ace wrap at the toes and work your way up to the thigh        If you had surgery on your upper extremity and do not have a splint on, start the ace wrap at your fingers and work your way up to the upper arm  IF YOU ARE IN A SPLINT OR CAST DO NOT REMOVE IT FOR ANY REASON   If your splint gets wet for any reason please contact the office immediately. You may shower in your splint or cast as long as you keep it dry.  This can be done by wrapping in a cast cover or garbage  back (or similar)  Do Not stick any thing down your splint or cast such as pencils, money, or hangers to try and scratch yourself with.  If you feel itchy take benadryl as prescribed on the bottle for itching  IF YOU ARE IN A CAM BOOT (BLACK BOOT)  You may remove boot periodically. Perform daily dressing changes as noted below.  Wash the liner of the boot regularly and wear a sock when wearing the boot. It is recommended that you sleep in the boot until told otherwise  CALL THE OFFICE WITH ANY QUESTIONS OR CONCERTS: [phone]

## 2015-12-27 NOTE — Care Management Note (Signed)
Case Management Note  Patient Details  Name: Lucciano Vitali MRN: [id number] Date of Birth: 1986-10-14  Subjective/Objective:      Pt medically stable for dc home today with girlfriend.    PT recommending HH follow up, but unfortunately, pt does not have qualifying diagnosis for home therapy as self pay.       Action/Plan: Pt is eligible for medication assist with MATCH program.  [facility] letter provided with explanation of program benefits.  DME delivered to room prior to dc.    Expected Discharge Date:    12/26/15              Expected Discharge Plan:  Home/Self Care  In-House Referral:  Clinical Social Work  Discharge planning Services  CM Consult  Post Acute Care Choice:  Durable Medical Equipment Choice offered to:     DME Arranged:  3-N-1Dan Humphreys DME Agency:  [facility]  HH Arranged:    HH Agency:     Status of Service:  Completed, signed off  Medicare Important Message Given:    Date Medicare IM Given:    Medicare IM give by:    Date Additional Medicare IM Given:    Additional Medicare Important Message give by:     If discussed at Long Length of Stay Meetings, dates discussed:    Additional Comments:  Glennon Mac, RN 12/27/2015, 4:06 PM

## 2015-12-28 ENCOUNTER — Encounter (HOSPITAL_COMMUNITY): Payer: Self-pay | Admitting: Orthopedic Surgery

## 2016-01-04 ENCOUNTER — Encounter (HOSPITAL_COMMUNITY): Payer: Self-pay | Admitting: Orthopedic Surgery

## 2016-01-18 ENCOUNTER — Encounter (HOSPITAL_COMMUNITY): Payer: Self-pay | Admitting: Orthopedic Surgery

## 2016-01-23 ENCOUNTER — Encounter (HOSPITAL_COMMUNITY): Payer: Self-pay | Admitting: Orthopedic Surgery

## 2016-01-25 ENCOUNTER — Encounter (HOSPITAL_COMMUNITY): Payer: Self-pay | Admitting: Orthopedic Surgery

## 2016-02-15 ENCOUNTER — Ambulatory Visit: Payer: Medicaid Other | Admitting: Physical Therapy

## 2016-02-16 ENCOUNTER — Ambulatory Visit: Payer: Medicaid Other | Attending: Orthopedic Surgery | Admitting: Physical Therapy

## 2016-02-16 DIAGNOSIS — R262 Difficulty in walking, not elsewhere classified: Secondary | ICD-10-CM | POA: Diagnosis present

## 2016-02-16 DIAGNOSIS — M6281 Muscle weakness (generalized): Secondary | ICD-10-CM | POA: Insufficient documentation

## 2016-02-16 DIAGNOSIS — M25551 Pain in right hip: Secondary | ICD-10-CM | POA: Insufficient documentation

## 2016-02-16 DIAGNOSIS — R2681 Unsteadiness on feet: Secondary | ICD-10-CM | POA: Insufficient documentation

## 2016-02-16 DIAGNOSIS — M25561 Pain in right knee: Secondary | ICD-10-CM | POA: Insufficient documentation
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
## 2016-02-26 ENCOUNTER — Encounter (HOSPITAL_COMMUNITY): Payer: Self-pay | Admitting: Emergency Medicine

## 2016-02-26 ENCOUNTER — Emergency Department (HOSPITAL_COMMUNITY)
Admission: EM | Admit: 2016-02-26 | Discharge: 2016-02-26 | Disposition: A | Discharge status: Home / Self Care | Payer: Medicaid Other | Attending: Emergency Medicine | Admitting: Emergency Medicine

## 2016-02-26 DIAGNOSIS — F17210 Nicotine dependence, cigarettes, uncomplicated: Secondary | ICD-10-CM | POA: Diagnosis not present

## 2016-02-26 DIAGNOSIS — M545 Low back pain: Secondary | ICD-10-CM | POA: Insufficient documentation

## 2016-02-26 DIAGNOSIS — M25551 Pain in right hip: Secondary | ICD-10-CM | POA: Diagnosis present

## 2016-02-26 NOTE — ED Notes (Signed)
Pt. Stated, I've been having rt. Hip and lower back. I had hip surgery 2 months ago.

## 2016-02-26 NOTE — ED Notes (Signed)
Called pt name x3 for treatment room, no answer

## 2016-02-26 NOTE — ED Notes (Signed)
Called pt name x2 for treatment room, no answer.

## 2016-03-02 ENCOUNTER — Encounter (HOSPITAL_COMMUNITY): Payer: Self-pay

## 2016-03-02 ENCOUNTER — Emergency Department (HOSPITAL_COMMUNITY)
Admission: EM | Admit: 2016-03-02 | Discharge: 2016-03-02 | Disposition: A | Discharge status: Home / Self Care | Payer: Medicaid Other | Attending: Emergency Medicine | Admitting: Emergency Medicine

## 2016-03-02 ENCOUNTER — Emergency Department (HOSPITAL_COMMUNITY): Payer: Medicaid Other

## 2016-03-02 DIAGNOSIS — Y9389 Activity, other specified: Secondary | ICD-10-CM | POA: Diagnosis not present

## 2016-03-02 DIAGNOSIS — M79651 Pain in right thigh: Secondary | ICD-10-CM

## 2016-03-02 DIAGNOSIS — M76891 Other specified enthesopathies of right lower limb, excluding foot: Secondary | ICD-10-CM | POA: Insufficient documentation

## 2016-03-02 DIAGNOSIS — Z9889 Other specified postprocedural states: Secondary | ICD-10-CM | POA: Insufficient documentation

## 2016-03-02 DIAGNOSIS — F17210 Nicotine dependence, cigarettes, uncomplicated: Secondary | ICD-10-CM | POA: Insufficient documentation

## 2016-03-02 DIAGNOSIS — M549 Dorsalgia, unspecified: Secondary | ICD-10-CM | POA: Insufficient documentation

## 2016-03-02 DIAGNOSIS — M7061 Trochanteric bursitis, right hip: Secondary | ICD-10-CM | POA: Diagnosis not present

## 2016-03-02 DIAGNOSIS — S72391G Other fracture of shaft of right femur, subsequent encounter for closed fracture with delayed healing: Secondary | ICD-10-CM | POA: Insufficient documentation

## 2016-03-02 MED ORDER — KETOROLAC TROMETHAMINE 30 MG/ML IJ SOLN
60.0000 mg | Freq: Once | INTRAMUSCULAR | Status: AC
  Administered 2016-03-02: 60 mg via INTRAMUSCULAR
  Filled 2016-03-02: qty 2

## 2016-03-02 MED ORDER — MELOXICAM 15 MG PO TABS: 15.0000 mg | ORAL_TABLET | Freq: Every day | ORAL | Status: DC

## 2016-03-02 NOTE — ED Notes (Signed)
[facility] notified of order for crutches.

## 2016-03-02 NOTE — ED Notes (Signed)
Pt c/o Lower Back/Extremity Pain r/t Back/Leg Surgery that was performed in Feb. Pt arrives stating 10/10 lower back and leg pain along with inability to ambulate. Pt required assistance into wheelchair from POV. Pt A+OX4, speaking in complete sentences.
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
## 2016-03-02 NOTE — ED Notes (Signed)
Patient stated when discharged, "I will be back tonight. That pain medicine she gave me ain't worth s---."
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
## 2016-03-02 NOTE — Discharge Instructions (Signed)
Your Xray shows that your femur (thigh bone) fracture is still trying to heal, but hasn't fully healed. Use crutches as needed for comfort, but make sure you continue using your right leg to weight bear. Take mobic daily as directed which will help with inflammation and pain. Use additional tylenol as needed for pain, or use your home percocet for breakthrough pain and home robaxin for muscle relaxation. Do not drive or operate machinery with pain medication or muscle relaxant use. Use ice and heat to areas of soreness, no more than 20 minutes at a time every hour for each. You can use a tennis ball or foam roller to help stretch the muscles and tendons on the outside of your thigh, this will help with pain and improve movement of the area. Continue doing the outpatient therapies that the physical therapist gave you and the exercises/stretches listed below, but you may want to consider doing physical therapy at the [facility] (information listed above, call them to inquire about what is needed to attend therapy there). Follow up with your orthopedic surgeon Dr. Carola Frost in the next 1 week for recheck of ongoing symptoms and to reassess the fracture healing process. Return to ER for emergent changing or worsening of symptoms.     Hip Bursitis Bursitis is a puffiness (swelling) and soreness of a fluid-filled sac (bursa). This sac covers and protects the joint. HOME CARE  Put ice on the injured area.  Put ice in a plastic bag.  Place a towel between your skin and the bag.  Leave the ice on for 15-20 minutes, 03-04 times a day.  Rest the painful joint as much as possible. Move your joint at least 4 times a day. When pain lessens, start normal, slow movements and normal activities.  Only take medicine as told by your doctor.  Use crutches as told.  Raise (elevate) your painful joint. Use pillows for propping your legs and hips.  Get a massage to lessen pain. GET HELP RIGHT  AWAY IF:  Your pain increases or does not improve during treatment.  You have a fever.  You feel heat coming from the affected area.  You see redness and puffiness around the affected area.  You have any questions or concerns. MAKE SURE YOU:  Understand these instructions.  Will watch your condition.  Will get help right away if you are not well or get worse.   This information is not intended to replace advice given to you by your health care provider. Make sure you discuss any questions you have with your health care provider.   Document Released: 11/17/2010 Document Revised: 01/07/2012 Document Reviewed: 05/17/2015 Elsevier Interactive Patient Education 2016 [employer]  Musculoskeletal Pain Musculoskeletal pain is muscle and boney aches and pains. These pains can occur in any part of the body. Your caregiver may treat you without knowing the cause of the pain. They may treat you if blood or urine tests, X-rays, and other tests were normal.  CAUSES There is often not a definite cause or reason for these pains. These pains may be caused by a type of germ (virus). The discomfort may also come from overuse. Overuse includes working out too hard when your body is not fit. Boney aches also come from weather changes. Bone is sensitive to atmospheric pressure changes. HOME CARE INSTRUCTIONS   Ask when your test results will be ready. Make sure you get your test results.  Only take over-the-counter or prescription medicines for  pain, discomfort, or fever as directed by your caregiver. If you were given medications for your condition, do not drive, operate machinery or power tools, or sign legal documents for 24 hours. Do not drink alcohol. Do not take sleeping pills or other medications that may interfere with treatment.  Continue all activities unless the activities cause more pain. When the pain lessens, slowly resume normal activities. Gradually increase the intensity and duration  of the activities or exercise.  During periods of severe pain, bed rest may be helpful. Lay or sit in any position that is comfortable.  Putting ice on the injured area.  Put ice in a bag.  Place a towel between your skin and the bag.  Leave the ice on for 15 to 20 minutes, 3 to 4 times a day.  Follow up with your caregiver for continued problems and no reason can be found for the pain. If the pain becomes worse or does not go away, it may be necessary to repeat tests or do additional testing. Your caregiver may need to look further for a possible cause. SEEK IMMEDIATE MEDICAL CARE IF:  You have pain that is getting worse and is not relieved by medications.  You develop chest pain that is associated with shortness or breath, sweating, feeling sick to your stomach (nauseous), or throw up (vomit).  Your pain becomes localized to the abdomen.  You develop any new symptoms that seem different or that concern you. MAKE SURE YOU:   Understand these instructions.  Will watch your condition.  Will get help right away if you are not doing well or get worse.   This information is not intended to replace advice given to you by your health care provider. Make sure you discuss any questions you have with your health care provider.   Document Released: 10/15/2005 Document Revised: 01/07/2012 Document Reviewed: 06/19/2013 Elsevier Interactive Patient Education 2016 Elsevier Inc.  Trochanteric Bursitis You have hip pain due to trochanteric bursitis. Bursitis means that the sack near the outside of the hip is filled with fluid and inflamed. This sack is made up of protective soft tissue. The pain from trochanteric bursitis can be severe and keep you from sleep. It can radiate to the buttocks or down the outside of the thigh to the knee. The pain is almost always worse when rising from the seated or lying position and with walking. Pain can improve after you take a few steps. It happens more often  in people with hip joint and lumbar spine problems, such as arthritis or previous surgery. Very rarely the trochanteric bursa can become infected, and antibiotics and/or surgery may be needed. Treatment often includes an injection of local anesthetic mixed with cortisone medicine. This medicine is injected into the area where it is most tender over the hip. Repeat injections may be necessary if the response to treatment is slow. You can apply ice packs over the tender area for 30 minutes every 2 hours for the next few days. Anti-inflammatory and/or narcotic pain medicine may also be helpful. Limit your activity for the next few days if the pain continues. See your caregiver in 5-10 days if you are not greatly improved.  SEEK IMMEDIATE MEDICAL CARE IF:  You develop severe pain, fever, or increased redness.  You have pain that radiates below the knee. EXERCISES STRETCHING EXERCISES - Trochanteric Bursitis  These exercises may help you when beginning to rehabilitate your injury. Your symptoms may resolve with or without further involvement from your  physician, physical therapist, or athletic trainer. While completing these exercises, remember:   Restoring tissue flexibility helps normal motion to return to the joints. This allows healthier, less painful movement and activity.  An effective stretch should be held for at least 30 seconds.  A stretch should never be painful. You should only feel a gentle lengthening or release in the stretched tissue. STRETCH - Iliotibial Band  On the floor or bed, lie on your side so your injured leg is on top. Bend your knee and grab your ankle.  Slowly bring your knee back so that your thigh is in line with your trunk. Keep your heel at your buttocks and gently arch your back so your head, shoulders and hips line up.  Slowly lower your leg so that your knee approaches the floor/bed until you feel a gentle stretch on the outside of your thigh. If you do not feel a  stretch and your knee will not fall farther, place the heel of your opposite foot on top of your knee and pull your thigh down farther.  Hold this stretch for ___3_______ seconds.  Repeat ______10____ times. Complete this exercise ___2_______ times per day. STRETCH - Hamstrings, Supine   Lie on your back. Loop a belt or towel over the ball of your foot as shown.  Straighten your knee and slowly pull on the belt to raise your injured leg. Do not allow the knee to bend. Keep your opposite leg flat on the floor.  Raise the leg until you feel a gentle stretch behind your knee or thigh. Hold this position for ______5____ seconds.  Repeat _____10_____ times. Complete this stretch ______2____ times per day. STRETCH - Quadriceps, Prone   Lie on your stomach on a firm surface, such as a bed or padded floor.  Bend your knee and grasp your ankle. If you are unable to reach your ankle or pant leg, use a belt around your foot to lengthen your reach.  Gently pull your heel toward your buttocks. Your knee should not slide out to the side. You should feel a stretch in the front of your thigh and/or knee.  Hold this position for _____5_____ seconds.  Repeat _____10_____ times. Complete this stretch ____2______ times per day. STRETCHING - Hip Flexors, Lunge Half kneel with your knee on the floor and your opposite knee bent and directly over your ankle.  Keep good posture with your head over your shoulders. Tighten your buttocks to point your tailbone downward; this will prevent your back from arching too much.  You should feel a gentle stretch in the front of your thigh and/or hip. If you do not feel any resistance, slightly slide your opposite foot forward and then slowly lunge forward so your knee once again lines up over your ankle. Be sure your tailbone remains pointed downward.  Hold this stretch for ____3______ seconds.  Repeat _____15_____ times. Complete this stretch ______2____ times per  day. STRETCH - Adductors, Lunge  While standing, spread your legs.  Lean away from your injured leg by bending your opposite knee. You may rest your hands on your thigh for balance.  You should feel a stretch in your inner thigh. Hold for ____3______ seconds.  Repeat ______15____ times. Complete this exercise ___2_______ times per day.   This information is not intended to replace advice given to you by your health care provider. Make sure you discuss any questions you have with your health care provider.   Document Released: 11/22/2004 Document Revised: 03/01/2015  Document Reviewed: 01/27/2009 Elsevier Interactive Patient Education 2016 Elsevier Inc.  Cryotherapy Cryotherapy means treatment with cold. Ice or gel packs can be used to reduce both pain and swelling. Ice is the most helpful within the first 24 to 48 hours after an injury or flare-up from overusing a muscle or joint. Sprains, strains, spasms, burning pain, shooting pain, and aches can all be eased with ice. Ice can also be used when recovering from surgery. Ice is effective, has very few side effects, and is safe for most people to use. PRECAUTIONS  Ice is not a safe treatment option for people with:  Raynaud phenomenon. This is a condition affecting small blood vessels in the extremities. Exposure to cold may cause your problems to return.  Cold hypersensitivity. There are many forms of cold hypersensitivity, including:  Cold urticaria. Red, itchy hives appear on the skin when the tissues begin to warm after being iced.  Cold erythema. This is a red, itchy rash caused by exposure to cold.  Cold hemoglobinuria. Red blood cells break down when the tissues begin to warm after being iced. The hemoglobin that carry oxygen are passed into the urine because they cannot combine with blood proteins fast enough.  Numbness or altered sensitivity in the area being iced. If you have any of the following conditions, do not use ice  until you have discussed cryotherapy with your caregiver:  Heart conditions, such as arrhythmia, angina, or chronic heart disease.  High blood pressure.  Healing wounds or open skin in the area being iced.  Current infections.  Rheumatoid arthritis.  Poor circulation.  Diabetes. Ice slows the blood flow in the region it is applied. This is beneficial when trying to stop inflamed tissues from spreading irritating chemicals to surrounding tissues. However, if you expose your skin to cold temperatures for too long or without the proper protection, you can damage your skin or nerves. Watch for signs of skin damage due to cold. HOME CARE INSTRUCTIONS Follow these tips to use ice and cold packs safely.  Place a dry or damp towel between the ice and skin. A damp towel will cool the skin more quickly, so you may need to shorten the time that the ice is used.  For a more rapid response, add gentle compression to the ice.  Ice for no more than 10 to 20 minutes at a time. The bonier the area you are icing, the less time it will take to get the benefits of ice.  Check your skin after 5 minutes to make sure there are no signs of a poor response to cold or skin damage.  Rest 20 minutes or more between uses.  Once your skin is numb, you can end your treatment. You can test numbness by very lightly touching your skin. The touch should be so light that you do not see the skin dimple from the pressure of your fingertip. When using ice, most people will feel these normal sensations in this order: cold, burning, aching, and numbness.  Do not use ice on someone who cannot communicate their responses to pain, such as small children or people with dementia. HOW TO MAKE AN ICE PACK Ice packs are the most common way to use ice therapy. Other methods include ice massage, ice baths, and cryosprays. Muscle creams that cause a cold, tingly feeling do not offer the same benefits that ice offers and should not be  used as a substitute unless recommended by your caregiver. To make an ice  pack, do one of the following:  Place crushed ice or a bag of frozen vegetables in a sealable plastic bag. Squeeze out the excess air. Place this bag inside another plastic bag. Slide the bag into a pillowcase or place a damp towel between your skin and the bag.  Mix 3 parts water with 1 part rubbing alcohol. Freeze the mixture in a sealable plastic bag. When you remove the mixture from the freezer, it will be slushy. Squeeze out the excess air. Place this bag inside another plastic bag. Slide the bag into a pillowcase or place a damp towel between your skin and the bag. SEEK MEDICAL CARE IF:  You develop [place] spots on your skin. This may give the skin a blotchy (mottled) appearance.  Your skin turns blue or pale.  Your skin becomes waxy or hard.  Your swelling gets worse. MAKE SURE YOU:   Understand these instructions.  Will watch your condition.  Will get help right away if you are not doing well or get worse.   This information is not intended to replace advice given to you by your health care provider. Make sure you discuss any questions you have with your health care provider.   Document Released: 06/11/2011 Document Revised: 11/05/2014 Document Reviewed: 06/11/2011 Elsevier Interactive Patient Education 2016 Elsevier Inc.  Foot LockerHeat Therapy Heat therapy can help ease sore, stiff, injured, and tight muscles and joints. Heat relaxes your muscles, which may help ease your pain.  RISKS AND COMPLICATIONS If you have any of the following conditions, do not use heat therapy unless your health care provider has approved:  Poor circulation.  Healing wounds or scarred skin in the area being treated.  Diabetes, heart disease, or high blood pressure.  Not being able to feel (numbness) the area being treated.  Unusual swelling of the area being treated.  Active infections.  Blood  clots.  Cancer.  Inability to communicate pain. This may include young children and people who have problems with their brain function (dementia).  Pregnancy. Heat therapy should only be used on old, pre-existing, or long-lasting (chronic) injuries. Do not use heat therapy on new injuries unless directed by your health care provider. HOW TO USE HEAT THERAPY There are several different kinds of heat therapy, including:  Moist heat pack.  Warm water bath.  Hot water bottle.  Electric heating pad.  Heated gel pack.  Heated wrap.  Electric heating pad. Use the heat therapy method suggested by your health care provider. Follow your health care provider's instructions on when and how to use heat therapy. GENERAL HEAT THERAPY RECOMMENDATIONS  Do not sleep while using heat therapy. Only use heat therapy while you are awake.  Your skin may turn pink while using heat therapy. Do not use heat therapy if your skin turns red.  Do not use heat therapy if you have new pain.  High heat or long exposure to heat can cause burns. Be careful when using heat therapy to avoid burning your skin.  Do not use heat therapy on areas of your skin that are already irritated, such as with a rash or sunburn. SEEK MEDICAL CARE IF:  You have blisters, redness, swelling, or numbness.  You have new pain.  Your pain is worse. MAKE SURE YOU:  Understand these instructions.  Will watch your condition.  Will get help right away if you are not doing well or get worse.   This information is not intended to replace advice given to you by  your health care provider. Make sure you discuss any questions you have with your health care provider.   Document Released: 01/07/2012 Document Revised: 11/05/2014 Document Reviewed: 12/08/2013 Elsevier Interactive Patient Education [employer].

## 2016-03-02 NOTE — Progress Notes (Signed)
Pt with 2 ED visits on 03/02/16 [facility] this [facility] and [facility] No [facility] in last 6 months Chart review [facility] and this morning but left without being seen on both occasions.  Pt has outpatient PT session 02/16/16 (ws late for appt) Does not appear he has gone back to sessions  Used RW at home per PT staff note ED RN states today pt had to be assisted out of private vehicle  Pt in room with his right leg on a pillow and his left leg bent Pt on telephone when Cm arrived in room to speak with him  [facility] RW and EDP today will ask ortho tech to give him crutches Pt confirms medicaid allowed one PT session and he has the plan of Care from PT to continue exercises at home Pt asked Cm about his d/c instructions and he was informed the ED RN would bring them in to him Male visitor arrived to see pt

## 2016-03-08 ENCOUNTER — Other Ambulatory Visit: Payer: Self-pay | Admitting: Orthopedic Surgery

## 2016-03-08 DIAGNOSIS — M25561 Pain in right knee: Secondary | ICD-10-CM

## 2016-03-14 ENCOUNTER — Other Ambulatory Visit: Payer: Self-pay | Admitting: Orthopedic Surgery

## 2016-03-14 DIAGNOSIS — M25561 Pain in right knee: Secondary | ICD-10-CM

## 2016-03-23 ENCOUNTER — Inpatient Hospital Stay: Admission: RE | Admit: 2016-03-23 | Payer: Medicaid Other | Source: Ambulatory Visit

## 2016-03-23 ENCOUNTER — Other Ambulatory Visit: Payer: Medicaid Other

## 2016-04-10 ENCOUNTER — Other Ambulatory Visit: Payer: Medicaid Other

## 2016-04-10 ENCOUNTER — Inpatient Hospital Stay: Admission: RE | Admit: 2016-04-10 | Payer: Medicaid Other | Source: Ambulatory Visit

## 2016-04-24 ENCOUNTER — Encounter (HOSPITAL_COMMUNITY): Payer: Self-pay

## 2016-04-25 ENCOUNTER — Encounter (HOSPITAL_COMMUNITY): Payer: Self-pay | Admitting: Emergency Medicine

## 2016-04-25 ENCOUNTER — Emergency Department (HOSPITAL_COMMUNITY)
Admission: EM | Admit: 2016-04-25 | Discharge: 2016-04-25 | Disposition: A | Discharge status: Home / Self Care | Payer: No Typology Code available for payment source | Attending: Emergency Medicine | Admitting: Emergency Medicine

## 2016-04-25 ENCOUNTER — Emergency Department (HOSPITAL_COMMUNITY): Payer: No Typology Code available for payment source

## 2016-04-25 DIAGNOSIS — Y999 Unspecified external cause status: Secondary | ICD-10-CM | POA: Diagnosis not present

## 2016-04-25 DIAGNOSIS — M545 Low back pain: Secondary | ICD-10-CM | POA: Diagnosis not present

## 2016-04-25 DIAGNOSIS — F17210 Nicotine dependence, cigarettes, uncomplicated: Secondary | ICD-10-CM | POA: Diagnosis not present

## 2016-04-25 DIAGNOSIS — M25561 Pain in right knee: Secondary | ICD-10-CM | POA: Diagnosis present

## 2016-04-25 DIAGNOSIS — Y939 Activity, unspecified: Secondary | ICD-10-CM | POA: Insufficient documentation

## 2016-04-25 DIAGNOSIS — Y92410 Unspecified street and highway as the place of occurrence of the external cause: Secondary | ICD-10-CM | POA: Diagnosis not present

## 2016-04-25 DIAGNOSIS — R1030 Lower abdominal pain, unspecified: Secondary | ICD-10-CM | POA: Insufficient documentation

## 2016-04-25 MED ORDER — IBUPROFEN 400 MG PO TABS: 400.0000 mg | ORAL_TABLET | Freq: Four times a day (QID) | ORAL | Status: AC | PRN

## 2016-04-25 MED ORDER — IBUPROFEN 200 MG PO TABS
600.0000 mg | ORAL_TABLET | Freq: Once | ORAL | Status: AC
  Administered 2016-04-25: 600 mg via ORAL
  Filled 2016-04-25: qty 3
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
## 2016-07-31 ENCOUNTER — Emergency Department (HOSPITAL_COMMUNITY)
Admission: EM | Admit: 2016-07-31 | Discharge: 2016-07-31 | Disposition: A | Discharge status: Home / Self Care | Payer: Medicaid Other | Attending: Emergency Medicine | Admitting: Emergency Medicine

## 2016-07-31 ENCOUNTER — Emergency Department (HOSPITAL_COMMUNITY): Payer: Medicaid Other

## 2016-07-31 ENCOUNTER — Encounter (HOSPITAL_COMMUNITY): Payer: Self-pay | Admitting: Emergency Medicine

## 2016-07-31 DIAGNOSIS — Z791 Long term (current) use of non-steroidal anti-inflammatories (NSAID): Secondary | ICD-10-CM | POA: Insufficient documentation

## 2016-07-31 DIAGNOSIS — M79605 Pain in left leg: Secondary | ICD-10-CM

## 2016-07-31 DIAGNOSIS — F17210 Nicotine dependence, cigarettes, uncomplicated: Secondary | ICD-10-CM | POA: Insufficient documentation

## 2016-07-31 MED ORDER — TRAMADOL HCL 50 MG PO TABS: 50.0000 mg | ORAL_TABLET | Freq: Four times a day (QID) | ORAL | 0 refills | Status: AC | PRN

## 2016-07-31 MED ORDER — ENOXAPARIN SODIUM 120 MG/0.8ML ~~LOC~~ SOLN
115.0000 mg | SUBCUTANEOUS | Status: AC
  Administered 2016-07-31: 115 mg via SUBCUTANEOUS
  Filled 2016-07-31: qty 0.8

## 2016-07-31 MED ORDER — TRAMADOL HCL 50 MG PO TABS
50.0000 mg | ORAL_TABLET | Freq: Once | ORAL | Status: AC
  Administered 2016-07-31: 50 mg via ORAL
  Filled 2016-07-31: qty 1
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
## 2016-07-31 NOTE — Discharge Instructions (Signed)
You will need to get ultrasound done tomorrow morning at 8 am. Follow discharge instructions. If this is normal, and you are having ongoing knee pain please follow up with your orthopedic provider. Return to the ED if you experience severe worsening of your symptoms, chest pain, shortness of breath, swelling of your knee, fevers.
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
## 2016-08-01 ENCOUNTER — Ambulatory Visit (HOSPITAL_COMMUNITY): Admission: RE | Admit: 2016-08-01 | Payer: Medicaid Other | Source: Ambulatory Visit

## 2016-08-02 ENCOUNTER — Ambulatory Visit (HOSPITAL_COMMUNITY)
Admission: RE | Admit: 2016-08-02 | Discharge: 2016-08-02 | Disposition: A | Discharge status: Home / Self Care | Payer: Medicaid Other | Source: Ambulatory Visit | Attending: Emergency Medicine | Admitting: Emergency Medicine

## 2016-08-02 ENCOUNTER — Telehealth (HOSPITAL_COMMUNITY): Payer: Self-pay

## 2016-08-02 DIAGNOSIS — M7989 Other specified soft tissue disorders: Secondary | ICD-10-CM

## 2016-08-02 DIAGNOSIS — M25562 Pain in left knee: Secondary | ICD-10-CM | POA: Insufficient documentation

## 2016-08-02 DIAGNOSIS — M79609 Pain in unspecified limb: Secondary | ICD-10-CM | POA: Diagnosis not present

## 2016-08-02 NOTE — Telephone Encounter (Signed)
[facility] calling pts venous studied ordered by ED normal pt doesnt have paperwork with him F/U info w/Dr Carola FrostHandy for normal study provided to [facility] for pt.  Info found on pts AVS.

## 2016-08-02 NOTE — Progress Notes (Signed)
VASCULAR LAB PRELIMINARY  PRELIMINARY  PRELIMINARY  PRELIMINARY  Left lower extremity venous duplex completed.    Preliminary report:  Left:  No evidence of DVT, superficial thrombosis, or Baker's cyst.  Fallynn Gravett, RVS 08/02/2016, 11:27 AM

## 2016-09-07 ENCOUNTER — Emergency Department (HOSPITAL_COMMUNITY)
Admission: EM | Admit: 2016-09-07 | Discharge: 2016-09-07 | Disposition: A | Discharge status: Home / Self Care | Payer: Medicaid Other | Attending: Emergency Medicine | Admitting: Emergency Medicine

## 2016-09-07 ENCOUNTER — Encounter (HOSPITAL_COMMUNITY): Payer: Self-pay | Admitting: Emergency Medicine

## 2016-09-07 ENCOUNTER — Emergency Department (HOSPITAL_COMMUNITY): Payer: Medicaid Other

## 2016-09-07 DIAGNOSIS — M79661 Pain in right lower leg: Secondary | ICD-10-CM | POA: Insufficient documentation

## 2016-09-07 DIAGNOSIS — Z791 Long term (current) use of non-steroidal anti-inflammatories (NSAID): Secondary | ICD-10-CM | POA: Diagnosis not present

## 2016-09-07 DIAGNOSIS — F17210 Nicotine dependence, cigarettes, uncomplicated: Secondary | ICD-10-CM | POA: Diagnosis not present

## 2016-09-07 DIAGNOSIS — G8918 Other acute postprocedural pain: Secondary | ICD-10-CM | POA: Diagnosis present

## 2016-09-07 DIAGNOSIS — M79604 Pain in right leg: Secondary | ICD-10-CM

## 2016-09-07 MED ORDER — MELOXICAM 15 MG PO TABS: 15.0000 mg | ORAL_TABLET | Freq: Every day | ORAL | 0 refills | Status: AC

## 2016-09-07 MED ORDER — TRAMADOL HCL 50 MG PO TABS
50.0000 mg | ORAL_TABLET | Freq: Once | ORAL | Status: AC
  Administered 2016-09-07: 50 mg via ORAL
  Filled 2016-09-07: qty 1
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
## 2016-09-07 NOTE — Discharge Instructions (Signed)
Continue follow-up with your orthopedist as needed.  X-ray show proper healing of your right femur fracture

## 2016-09-07 NOTE — ED Triage Notes (Signed)
Per pt, states surgery on right knee in February-still having pain-states ortho not doing anything
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
## 2019-11-11 ENCOUNTER — Emergency Department (HOSPITAL_COMMUNITY): Payer: Medicaid Other

## 2019-11-11 ENCOUNTER — Emergency Department (HOSPITAL_COMMUNITY)
Admission: EM | Admit: 2019-11-11 | Discharge: 2019-11-11 | Discharge status: Against Medical Advice | Payer: Medicaid Other | Attending: Emergency Medicine | Admitting: Emergency Medicine

## 2019-11-11 ENCOUNTER — Encounter (HOSPITAL_COMMUNITY): Payer: Self-pay | Admitting: Radiology

## 2019-11-11 ENCOUNTER — Encounter (HOSPITAL_COMMUNITY): Payer: Self-pay | Admitting: Emergency Medicine

## 2019-11-11 DIAGNOSIS — W3400XA Accidental discharge from unspecified firearms or gun, initial encounter: Secondary | ICD-10-CM | POA: Insufficient documentation

## 2019-11-11 DIAGNOSIS — Z5320 Procedure and treatment not carried out because of patient's decision for unspecified reasons: Secondary | ICD-10-CM | POA: Diagnosis not present

## 2019-11-11 DIAGNOSIS — Y999 Unspecified external cause status: Secondary | ICD-10-CM | POA: Diagnosis not present

## 2019-11-11 DIAGNOSIS — Y939 Activity, unspecified: Secondary | ICD-10-CM | POA: Diagnosis not present

## 2019-11-11 DIAGNOSIS — Y92511 Restaurant or cafe as the place of occurrence of the external cause: Secondary | ICD-10-CM | POA: Insufficient documentation

## 2019-11-11 DIAGNOSIS — S0101XA Laceration without foreign body of scalp, initial encounter: Secondary | ICD-10-CM | POA: Insufficient documentation

## 2019-11-11 NOTE — ED Notes (Signed)
Pt refusing to stay, very agitated. Pt left AMA, MD notified.
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
## 2022-02-21 ENCOUNTER — Ambulatory Visit (HOSPITAL_COMMUNITY)
Admission: EM | Admit: 2022-02-21 | Discharge: 2022-02-21 | Disposition: A | Discharge status: Home / Self Care | Payer: Medicaid Other | Attending: Psychiatry | Admitting: Psychiatry

## 2022-02-21 DIAGNOSIS — Z789 Other specified health status: Secondary | ICD-10-CM

## 2022-02-21 DIAGNOSIS — Z008 Encounter for other general examination: Secondary | ICD-10-CM | POA: Insufficient documentation

## 2022-02-21 NOTE — Discharge Instructions (Addendum)
?  Please come to [facility] (this facility) during walk in hours for appointment with psychiatrist/provider for further medication management and for therapists for therapy.  ? ? Walk-Ins for medication management  are available on Monday, Wednesday, Thursday and Friday from 8am-11am.  It is first come, first -serve; it is best to arrive by 7:00 AM.  ? ? Walk-Ins for therapy are  available on Monday and Wednesday?s  8am-11am.  It is first come, first -serve; it is best to arrive by 7:00 AM.  ? ? ?When you arrive please go upstairs for your appointment. If you are unsure of where to go, inform the front desk that you are here for a walk in appointment and they will assist you with directions upstairs. ? ?Address:  ?[address], in [place], [address] ?Ph: [phone]  ? ? ? ? ? ?
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
## 2022-02-21 NOTE — BH Assessment (Signed)
Pt was referred here by [facility] for an mental health evaluation. Pt reports he was released from jail after 10 months on 01/30/22 for drinking and driving. Pt reports when released he was referred to [facility]. Pt reports he is on disability and reports diagnosis of ADHD and bipolar disorder. Pt denies SI, HI, AVH. Denies reports last alcohol drink was 11 months ago and denies substance use.  ?
